# Patient Record
Sex: Female | Born: 1960 | Race: Black or African American | Hispanic: No | Marital: Married | Smoking: Former smoker
Health system: Southern US, Community
[De-identification: ages and names within clinical notes are randomized; demographics above are authoritative.]

## PROBLEM LIST (undated history)

## (undated) DIAGNOSIS — I10 Essential (primary) hypertension: Secondary | ICD-10-CM

---

## 1999-09-21 ENCOUNTER — Encounter: Payer: Self-pay | Admitting: Neurosurgery

## 1999-09-21 ENCOUNTER — Ambulatory Visit (HOSPITAL_COMMUNITY): Admission: RE | Admit: 1999-09-21 | Discharge: 1999-09-21 | Payer: Self-pay | Admitting: Neurosurgery

## 1999-10-22 ENCOUNTER — Encounter: Payer: Self-pay | Admitting: Neurosurgery

## 1999-10-22 ENCOUNTER — Observation Stay (HOSPITAL_COMMUNITY): Admission: RE | Admit: 1999-10-22 | Discharge: 1999-10-22 | Payer: Self-pay | Admitting: Neurosurgery

## 1999-12-16 ENCOUNTER — Ambulatory Visit (HOSPITAL_COMMUNITY): Admission: RE | Admit: 1999-12-16 | Discharge: 1999-12-16 | Payer: Self-pay | Admitting: Neurosurgery

## 1999-12-16 ENCOUNTER — Encounter: Payer: Self-pay | Admitting: Neurosurgery

## 2009-07-24 ENCOUNTER — Emergency Department: Payer: Self-pay | Admitting: Emergency Medicine
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
## 2016-11-22 DIAGNOSIS — E782 Mixed hyperlipidemia: Secondary | ICD-10-CM | POA: Insufficient documentation

## 2017-11-17 LAB — HM HEPATITIS C SCREENING LAB: HM Hepatitis Screen: NEGATIVE

## 2018-05-05 LAB — HM COLONOSCOPY

## 2019-11-07 ENCOUNTER — Encounter: Payer: Self-pay | Admitting: Emergency Medicine

## 2019-11-07 ENCOUNTER — Other Ambulatory Visit: Payer: Self-pay

## 2019-11-07 ENCOUNTER — Ambulatory Visit
Admission: EM | Admit: 2019-11-07 | Discharge: 2019-11-07 | Disposition: A | Discharge status: Home / Self Care | Payer: BLUE CROSS/BLUE SHIELD | Attending: Family Medicine | Admitting: Family Medicine

## 2019-11-07 DIAGNOSIS — Z20828 Contact with and (suspected) exposure to other viral communicable diseases: Secondary | ICD-10-CM

## 2019-11-07 DIAGNOSIS — Z20822 Contact with and (suspected) exposure to covid-19: Secondary | ICD-10-CM

## 2019-11-07 HISTORY — DX: Essential (primary) hypertension: I10

## 2019-11-07 NOTE — ED Triage Notes (Signed)
Patient was exposed to niece that tested positive yesterday for COVID. Denies symptoms. She was around her niece on Monday.

## 2019-11-07 NOTE — ED Provider Notes (Signed)
[facility]    CSN: [id number] Arrival date & time: 11/07/19  1519      History   Chief Complaint Chief Complaint  Patient presents with  . COVID testing    HPI Diamond Crawford is a 58 y.o. female.   58 yo female with a c/o covid exposure and request for covid testing. States she feels fine and has no symptoms, however her niece was positive and patient was around her recently.      Past Medical History:  Diagnosis Date  . Hypertension     There are no problems to display for this patient.   History reviewed. No pertinent surgical history.  OB History   No obstetric history on file.      Home Medications    Prior to Admission medications   Medication Sig Start Date End Date Taking? Authorizing Provider  amLODipine (NORVASC) 5 MG tablet Take 5 mg by mouth daily. 10/16/19   [provider]    Family History History reviewed. No pertinent family history.  Social History Social History   Tobacco Use  . Smoking status: Never Smoker  . Smokeless tobacco: Never Used  Substance Use Topics  . Alcohol use: Yes  . Drug use: Never     Allergies   Patient has no known allergies.   Review of Systems Review of Systems   Physical Exam Triage Vital Signs ED Triage Vitals  Enc Vitals Group     BP 11/07/19 1540 139/88     Pulse Rate 11/07/19 1540 95     Resp 11/07/19 1540 18     Temp 11/07/19 1540 98.8 F (37.1 C)     Temp Source 11/07/19 1540 Oral     SpO2 11/07/19 1540 100 %     Weight 11/07/19 1538 127 lb (57.6 kg)     Height 11/07/19 1538 5\' 1"  (1.549 m)     Head Circumference --      Peak Flow --      Pain Score 11/07/19 1538 0     Pain Loc --      Pain Edu? --      Excl. in GC? --    No data found.  Updated Vital Signs BP 139/88 (BP Location: Right Arm)   Pulse 95   Temp 98.8 F (37.1 C) (Oral)   Resp 18   Ht 5\' 1"  (1.549 m)   Wt 57.6 kg   SpO2 100%   BMI 24.00 kg/m   Visual Acuity Right Eye Distance:     Left Eye Distance:   Bilateral Distance:    Right Eye Near:   Left Eye Near:    Bilateral Near:     Physical Exam Vitals and nursing note reviewed.  Constitutional:      General: She is not in acute distress.    Appearance: She is not toxic-appearing or diaphoretic.  Pulmonary:     Effort: Pulmonary effort is normal. No respiratory distress.  Neurological:     Mental Status: She is alert.      UC Treatments / Results  Labs (all labs ordered are listed, but only abnormal results are displayed) Labs Reviewed  NOVEL CORONAVIRUS, NAA (HOSP ORDER, SEND-OUT TO REF LAB; TAT 18-24 HRS)    EKG   Radiology No results found.  Procedures Procedures (including critical care time)  Medications Ordered in UC Medications - No data to display  Initial Impression / Assessment and Plan / UC Course  I have reviewed  the triage vital signs and the nursing notes.  Pertinent labs & imaging results that were available during my care of the patient were reviewed by me and considered in my medical decision making (see chart for details).      Final Clinical Impressions(s) / UC Diagnoses   Final diagnoses:  Exposure to COVID-19 virus    ED Prescriptions    None      1. diagnosis reviewed with patient 2. covid test done 3. Follow-up prn  PDMP not reviewed this encounter.   Norval Gable, MD 11/07/19 1616

## 2019-11-08 LAB — NOVEL CORONAVIRUS, NAA (HOSP ORDER, SEND-OUT TO REF LAB; TAT 18-24 HRS): SARS-CoV-2, NAA: NOT DETECTED

## 2020-12-25 LAB — RESULTS CONSOLE HPV: CHL HPV: NEGATIVE

## 2020-12-25 LAB — HM PAP SMEAR: HM Pap smear: NEGATIVE

## 2021-12-08 LAB — HM MAMMOGRAPHY

## 2022-07-16 LAB — LIPID PANEL
Cholesterol: 212 — AB (ref 0–200)
HDL: 62 (ref 35–70)
LDL Cholesterol: 72
Triglycerides: 392 — AB (ref 40–160)

## 2022-07-16 LAB — COMPREHENSIVE METABOLIC PANEL: Calcium: 9.5 (ref 8.7–10.7)

## 2022-07-16 LAB — BASIC METABOLIC PANEL
BUN: 11 (ref 4–21)
Chloride: 107 (ref 99–108)
Creatinine: 0.9 (ref 0.5–1.1)
Glucose: 87
Potassium: 3.6 mEq/L (ref 3.5–5.1)
Sodium: 138 (ref 137–147)

## 2022-07-16 LAB — HEPATIC FUNCTION PANEL
ALT: 24 U/L (ref 7–35)
AST: 33 (ref 13–35)
Alkaline Phosphatase: 63 (ref 25–125)
Bilirubin, Total: 0.4

## 2022-07-16 LAB — HEMOGLOBIN A1C: Hemoglobin A1C: 5.5

## 2022-12-08 LAB — HM MAMMOGRAPHY

## 2023-03-29 ENCOUNTER — Encounter: Payer: Self-pay | Admitting: Internal Medicine

## 2023-03-29 ENCOUNTER — Other Ambulatory Visit: Payer: Self-pay | Admitting: Internal Medicine

## 2023-03-29 ENCOUNTER — Ambulatory Visit (INDEPENDENT_AMBULATORY_CARE_PROVIDER_SITE_OTHER): Payer: 59 | Admitting: Internal Medicine

## 2023-03-29 VITALS — BP 122/66 | Ht 61.0 in | Wt 137.0 lb

## 2023-03-29 DIAGNOSIS — Z1231 Encounter for screening mammogram for malignant neoplasm of breast: Secondary | ICD-10-CM

## 2023-03-29 DIAGNOSIS — I10 Essential (primary) hypertension: Secondary | ICD-10-CM | POA: Diagnosis not present

## 2023-03-29 MED ORDER — AMLODIPINE BESYLATE 5 MG PO TABS: 5.0000 mg | ORAL_TABLET | Freq: Every day | ORAL | 1 refills | Status: DC

## 2023-03-29 NOTE — Patient Instructions (Addendum)
Start a fish oil supplement daily for your triglycerides and lipids.  Call [facility] to schedule your mammogram at [phone].   -It was a pleasure to see you today! Please review your visit summary for helpful information. -Lab results are usually available within 1-2 days and we will call once reviewed. -I would encourage you to follow your care via MyChart where you can access lab results, notes, messages, and more. -If you feel that we did a nice job today, please complete your after-visit survey and leave [place] a Google review! Your CMA today was Chassidy and your provider was Dr Bari Edward, MD.

## 2023-03-29 NOTE — Progress Notes (Signed)
Date:  03/29/2023   Name:  Diamond Crawford   DOB:  [birth year]   MRN:  [id number]   Chief Complaint: New Patient (Initial Visit)  Hypertension This is a chronic problem. The problem is controlled. Pertinent negatives include no chest pain, headaches, palpitations or shortness of breath. Past treatments include calcium channel blockers. The current treatment provides significant improvement. There is no history of kidney disease, CAD/MI or CVA.    Lab Results  Component Value Date   NA 138 07/16/2022   K 3.6 07/16/2022   BUN 11 07/16/2022   CREATININE 0.9 07/16/2022   CALCIUM 9.5 07/16/2022   Lab Results  Component Value Date   CHOL 212 (A) 07/16/2022   HDL 62 07/16/2022   LDLCALC 72 07/16/2022   TRIG 392 (A) 07/16/2022   No results found for: "TSH" Lab Results  Component Value Date   HGBA1C 5.5 07/16/2022   No results found for: "WBC", "HGB", "HCT", "MCV", "PLT" Lab Results  Component Value Date   ALT 24 07/16/2022   AST 33 07/16/2022   ALKPHOS 63 07/16/2022   No results found for: "25OHVITD2", "25OHVITD3", "VD25OH"   Review of Systems  Constitutional:  Negative for fatigue and unexpected weight change.  HENT:  Negative for nosebleeds.   Eyes:  Negative for visual disturbance.  Respiratory:  Negative for cough, chest tightness, shortness of breath and wheezing.   Cardiovascular:  Negative for chest pain, palpitations and leg swelling.  Gastrointestinal:  Negative for abdominal pain, constipation and diarrhea.  Genitourinary:  Negative for dyspareunia and pelvic pain.       Decreased libido, intermittent hot flashes  Musculoskeletal:  Negative for arthralgias and gait problem.  Neurological:  Negative for dizziness, weakness, light-headedness and headaches.  Psychiatric/Behavioral:  Negative for hallucinations and sleep disturbance. The patient is not nervous/anxious.     Patient Active Problem List   Diagnosis Date Noted   Essential hypertension 03/29/2023    Mixed hyperlipidemia 11/22/2016    No Known Allergies  History reviewed. No pertinent surgical history.  Social History   Tobacco Use   Smoking status: Former    Packs/day: 0.50    Years: 20.00    Additional pack years: 0.00    Total pack years: 10.00    Types: Cigarettes    Quit date: 2004    Years since quitting: 20.3   Smokeless tobacco: Never  Vaping Use   Vaping Use: Never used  Substance Use Topics   Alcohol use: Yes    Comment: rare   Drug use: Never     Medication list has been reviewed and updated.  Current Meds  Medication Sig   Black Cohosh (BLACK COHOSH HOT FLASH RELIEF) 40 MG CAPS Take 1 capsule by mouth daily at 6 (six) AM.   Flaxseed, Linseed, (FLAX SEED OIL) 1000 MG CAPS Take 1 capsule by mouth daily at 6 (six) AM.   Multiple Vitamin (MULTI-VITAMIN) tablet Take 1 tablet by mouth daily.   [DISCONTINUED] amLODipine (NORVASC) 5 MG tablet Take 5 mg by mouth daily.       03/29/2023    2:24 PM  GAD 7 : Generalized Anxiety Score  Nervous, Anxious, on Edge 0  Control/stop worrying 0  Worry too much - different things 0  Trouble relaxing 0  Restless 1  Easily annoyed or irritable 0  Afraid - awful might happen 0  Total GAD 7 Score 1  Anxiety Difficulty Not difficult at all  03/29/2023    2:24 PM  Depression screen PHQ 2/9  Decreased Interest 0  Down, Depressed, Hopeless 0  PHQ - 2 Score 0  Altered sleeping 0  Tired, decreased energy 1  Change in appetite 0  Feeling bad or failure about yourself  0  Trouble concentrating 0  Moving slowly or fidgety/restless 0  Suicidal thoughts 0  PHQ-9 Score 1  Difficult doing work/chores Not difficult at all    BP Readings from Last 3 Encounters:  03/29/23 122/66  11/07/19 139/88    Physical Exam Vitals and nursing note reviewed.  Constitutional:      General: She is not in acute distress.    Appearance: Normal appearance. She is well-developed.  HENT:     Head: Normocephalic and atraumatic.   Neck:     Vascular: No carotid bruit.  Cardiovascular:     Rate and Rhythm: Normal rate and regular rhythm.     Heart sounds: No murmur heard. Pulmonary:     Effort: Pulmonary effort is normal. No respiratory distress.     Breath sounds: No wheezing or rhonchi.  Musculoskeletal:     Cervical back: Normal range of motion.     Right lower leg: No edema.     Left lower leg: No edema.  Lymphadenopathy:     Cervical: No cervical adenopathy.  Skin:    General: Skin is warm and dry.     Findings: No rash.  Neurological:     General: No focal deficit present.     Mental Status: She is alert and oriented to person, place, and time.  Psychiatric:        Mood and Affect: Mood normal.        Behavior: Behavior normal.     Wt Readings from Last 3 Encounters:  03/29/23 137 lb (62.1 kg)  11/07/19 127 lb (57.6 kg)    BP 122/66 (BP Location: Right Arm, Cuff Size: Large)   Ht 5\' 1"  (1.549 m)   Wt 137 lb (62.1 kg)   BMI 25.89 kg/m   Assessment and Plan:  Problem List Items Addressed This Visit       Cardiovascular and Mediastinum   Essential hypertension - Primary    Stable exam with well controlled BP for the past few years.  Currently taking amlodipine. Tolerating medications without concerns or side effects. Will continue to recommend low sodium diet and current regimen.       Relevant Medications   amLODipine (NORVASC) 5 MG tablet   Other Visit Diagnoses     Encounter for screening mammogram for breast cancer       Relevant Orders   MM 3D SCREENING MAMMOGRAM BILATERAL BREAST       Return in about 4 months (around 07/30/2023) for CPX.   Partially dictated using Dragon software, any errors are not intentional.  Reubin Milan, MD [facility] [place], [place]

## 2023-03-29 NOTE — Assessment & Plan Note (Addendum)
Stable exam with well controlled BP for the past few years.  Currently taking amlodipine. Tolerating medications without concerns or side effects. Will continue to recommend low sodium diet and current regimen.

## 2023-03-30 ENCOUNTER — Other Ambulatory Visit: Payer: Self-pay | Admitting: *Deleted

## 2023-03-30 ENCOUNTER — Inpatient Hospital Stay
Admission: RE | Admit: 2023-03-30 | Discharge: 2023-03-30 | Disposition: A | Discharge status: Home / Self Care | Payer: Self-pay | Source: Ambulatory Visit | Attending: Internal Medicine | Admitting: Internal Medicine

## 2023-03-30 DIAGNOSIS — Z1231 Encounter for screening mammogram for malignant neoplasm of breast: Secondary | ICD-10-CM

## 2023-05-02 ENCOUNTER — Ambulatory Visit
Admission: RE | Admit: 2023-05-02 | Discharge: 2023-05-02 | Disposition: A | Discharge status: Home / Self Care | Payer: 59 | Source: Ambulatory Visit | Attending: Internal Medicine | Admitting: Internal Medicine

## 2023-05-02 DIAGNOSIS — Z1231 Encounter for screening mammogram for malignant neoplasm of breast: Secondary | ICD-10-CM | POA: Diagnosis not present

## 2023-05-03 ENCOUNTER — Other Ambulatory Visit: Payer: Self-pay | Admitting: Internal Medicine

## 2023-05-03 DIAGNOSIS — N6489 Other specified disorders of breast: Secondary | ICD-10-CM

## 2023-05-03 DIAGNOSIS — R928 Other abnormal and inconclusive findings on diagnostic imaging of breast: Secondary | ICD-10-CM

## 2023-05-11 ENCOUNTER — Ambulatory Visit
Admission: RE | Admit: 2023-05-11 | Discharge: 2023-05-11 | Disposition: A | Discharge status: Home / Self Care | Payer: 59 | Source: Ambulatory Visit | Attending: Internal Medicine | Admitting: Internal Medicine

## 2023-05-11 DIAGNOSIS — N6489 Other specified disorders of breast: Secondary | ICD-10-CM | POA: Insufficient documentation

## 2023-05-11 DIAGNOSIS — R928 Other abnormal and inconclusive findings on diagnostic imaging of breast: Secondary | ICD-10-CM | POA: Insufficient documentation

## 2023-09-01 ENCOUNTER — Encounter: Payer: Self-pay | Admitting: Internal Medicine

## 2023-09-01 ENCOUNTER — Ambulatory Visit (INDEPENDENT_AMBULATORY_CARE_PROVIDER_SITE_OTHER): Payer: 59 | Admitting: Internal Medicine

## 2023-09-01 VITALS — BP 132/84 | HR 64 | Ht 61.0 in | Wt 132.4 lb

## 2023-09-01 DIAGNOSIS — I10 Essential (primary) hypertension: Secondary | ICD-10-CM | POA: Diagnosis not present

## 2023-09-01 DIAGNOSIS — E782 Mixed hyperlipidemia: Secondary | ICD-10-CM | POA: Diagnosis not present

## 2023-09-01 DIAGNOSIS — S56912A Strain of unspecified muscles, fascia and tendons at forearm level, left arm, initial encounter: Secondary | ICD-10-CM

## 2023-09-01 DIAGNOSIS — Z23 Encounter for immunization: Secondary | ICD-10-CM | POA: Diagnosis not present

## 2023-09-01 DIAGNOSIS — Z0000 Encounter for general adult medical examination without abnormal findings: Secondary | ICD-10-CM | POA: Diagnosis not present

## 2023-09-01 MED ORDER — AMLODIPINE BESYLATE 10 MG PO TABS: 10.0000 mg | ORAL_TABLET | Freq: Every day | ORAL | 1 refills | Status: DC

## 2023-09-01 NOTE — Assessment & Plan Note (Signed)
Lipids managed with diet. Lab Results  Component Value Date   LDLCALC 72 07/16/2022

## 2023-09-01 NOTE — Patient Instructions (Signed)
For elbow pain - recommend limited lifting, ice or heat and Advil if needed

## 2023-09-01 NOTE — Assessment & Plan Note (Addendum)
Normal exam with stable BP on amlodipine.  Readings running 138-140 at home. No concerns or side effects to current medication. continue low sodium diet; will increase Amlodipine to 10 mg. Recheck in 2 mo.

## 2023-09-01 NOTE — Progress Notes (Signed)
Date:  09/01/2023   Name:  Diamond Crawford   DOB:  08-Jan-1961   MRN:  [id number]   Chief Complaint: Annual Exam Diamond Crawford is a 62 y.o. female who presents today for her Complete Annual Exam. She feels well. She reports exercising- walking. She reports she is sleeping well. Breast complaints - none.  Mammogram: 04/2023 DEXA: none Colonoscopy: 04/2018 Pap: 12/2020 neg/neg  Health Maintenance Due  Topic Date Due   HIV Screening  Never done   Zoster Vaccines- Shingrix (1 of 2) Never done   COVID-19 Vaccine (5 - 2023-24 season) 07/24/2023    Immunization History  Administered Date(s) Administered   Hepatitis B, ADULT 06/21/2014, 07/22/2014, 12/23/2014   Influenza Inj Mdck Quad Pf 12/25/2020   Influenza, Seasonal, Injecte, Preservative Fre 09/01/2023   Influenza,inj,Quad PF,6+ Mos Jun 06, 1961, 08/24/2017, 10/16/2019, 08/10/2022   MMR 06/21/2014   Moderna Sars-Covid-2 Vaccination 02/01/2020, 02/29/2020, 05/26/2020, 02/06/2021   Tdap 06/21/2014     Hypertension This is a chronic problem. The problem is controlled (at home 138-140). Pertinent negatives include no anxiety, chest pain, headaches, malaise/fatigue, orthopnea, palpitations, peripheral edema or shortness of breath. Past treatments include calcium channel blockers. There is no history of kidney disease, CAD/MI or CVA.    Review of Systems  Constitutional:  Negative for chills, fatigue, fever and malaise/fatigue.  HENT:  Negative for congestion, hearing loss, tinnitus, trouble swallowing and voice change.   Eyes:  Negative for visual disturbance.  Respiratory:  Negative for cough, chest tightness, shortness of breath and wheezing.   Cardiovascular:  Negative for chest pain, palpitations, orthopnea and leg swelling.  Gastrointestinal:  Negative for abdominal pain, constipation, diarrhea and vomiting.  Endocrine: Negative for polydipsia and polyuria.  Genitourinary:  Negative for dysuria, frequency, genital sores, vaginal  bleeding and vaginal discharge.  Musculoskeletal:  Positive for arthralgias (left elbow pain). Negative for gait problem and joint swelling.  Skin:  Negative for color change and rash.  Neurological:  Negative for dizziness, tremors, light-headedness and headaches.  Hematological:  Negative for adenopathy. Does not bruise/bleed easily.  Psychiatric/Behavioral:  Negative for dysphoric mood and sleep disturbance. The patient is not nervous/anxious.      Lab Results  Component Value Date   NA 138 07/16/2022   K 3.6 07/16/2022   BUN 11 07/16/2022   CREATININE 0.9 07/16/2022   CALCIUM 9.5 07/16/2022   Lab Results  Component Value Date   CHOL 212 (A) 07/16/2022   HDL 62 07/16/2022   LDLCALC 72 07/16/2022   TRIG 392 (A) 07/16/2022   No results found for: "TSH" Lab Results  Component Value Date   HGBA1C 5.5 07/16/2022   No results found for: "WBC", "HGB", "HCT", "MCV", "PLT" Lab Results  Component Value Date   ALT 24 07/16/2022   AST 33 07/16/2022   ALKPHOS 63 07/16/2022   No results found for: "25OHVITD2", "25OHVITD3", "VD25OH"   Patient Active Problem List   Diagnosis Date Noted   Essential hypertension 03/29/2023   Mixed hyperlipidemia 11/22/2016    No Known Allergies  History reviewed. No pertinent surgical history.  Social History   Tobacco Use   Smoking status: Former    Current packs/day: 0.00    Average packs/day: 0.5 packs/day for 20.0 years (10.0 ttl pk-yrs)    Types: Cigarettes    Start date: 57    Quit date: 2004    Years since quitting: 20.7   Smokeless tobacco: Never  Vaping Use   Vaping status: Never Used  Substance Use Topics   Alcohol use: Yes    Comment: rare   Drug use: Never     Medication list has been reviewed and updated.  No outpatient medications have been marked as taking for the 09/01/23 encounter (Office Visit) with Reubin Milan, MD.       09/01/2023    8:14 AM 03/29/2023    2:24 PM  GAD 7 : Generalized Anxiety Score   Nervous, Anxious, on Edge 0 0  Control/stop worrying 0 0  Worry too much - different things 0 0  Trouble relaxing 0 0  Restless 0 1  Easily annoyed or irritable 0 0  Afraid - awful might happen 0 0  Total GAD 7 Score 0 1  Anxiety Difficulty Not difficult at all Not difficult at all       09/01/2023    8:13 AM 03/29/2023    2:24 PM  Depression screen PHQ 2/9  Decreased Interest 0 0  Down, Depressed, Hopeless 0 0  PHQ - 2 Score 0 0  Altered sleeping 0 0  Tired, decreased energy 0 1  Change in appetite 0 0  Feeling bad or failure about yourself  0 0  Trouble concentrating 0 0  Moving slowly or fidgety/restless 0 0  Suicidal thoughts 0 0  PHQ-9 Score 0 1  Difficult doing work/chores Not difficult at all Not difficult at all    BP Readings from Last 3 Encounters:  09/01/23 132/84  03/29/23 122/66  11/07/19 139/88    Physical Exam Vitals and nursing note reviewed.  Constitutional:      General: She is not in acute distress.    Appearance: She is well-developed.  HENT:     Head: Normocephalic and atraumatic.     Right Ear: Tympanic membrane and ear canal normal.     Left Ear: Tympanic membrane and ear canal normal.     Nose:     Right Sinus: No maxillary sinus tenderness.     Left Sinus: No maxillary sinus tenderness.  Eyes:     General: No scleral icterus.       Right eye: No discharge.        Left eye: No discharge.     Conjunctiva/sclera: Conjunctivae normal.  Neck:     Thyroid: No thyromegaly.     Vascular: No carotid bruit.  Cardiovascular:     Rate and Rhythm: Normal rate and regular rhythm.     Pulses: Normal pulses.     Heart sounds: Normal heart sounds.  Pulmonary:     Effort: Pulmonary effort is normal. No respiratory distress.     Breath sounds: No wheezing.  Chest:  Breasts:    Right: No mass, nipple discharge, skin change or tenderness.     Left: No mass, nipple discharge, skin change or tenderness.  Abdominal:     General: Bowel sounds are  normal.     Palpations: Abdomen is soft.     Tenderness: There is no abdominal tenderness.  Musculoskeletal:     Left elbow: No swelling, deformity or effusion. Normal range of motion. Tenderness present.     Cervical back: Normal range of motion. No erythema.     Right lower leg: No edema.     Left lower leg: No edema.  Lymphadenopathy:     Cervical: No cervical adenopathy.  Skin:    General: Skin is warm and dry.     Findings: No rash.  Neurological:     Mental Status: She  is alert and oriented to person, place, and time.     Cranial Nerves: No cranial nerve deficit.     Sensory: No sensory deficit.     Deep Tendon Reflexes: Reflexes are normal and symmetric.  Psychiatric:        Attention and Perception: Attention normal.        Mood and Affect: Mood normal.     Wt Readings from Last 3 Encounters:  09/01/23 132 lb 6.4 oz (60.1 kg)  03/29/23 137 lb (62.1 kg)  11/07/19 127 lb (57.6 kg)    BP 132/84 (BP Location: Left Arm, Cuff Size: Normal)   Pulse 64   Ht 5\' 1"  (1.549 m)   Wt 132 lb 6.4 oz (60.1 kg)   BMI 25.02 kg/m   Assessment and Plan:  Problem List Items Addressed This Visit       Unprioritized   Essential hypertension    Normal exam with stable BP on amlodipine.  Readings running 138-140 at home. No concerns or side effects to current medication. continue low sodium diet; will increase Amlodipine to 10 mg. Recheck in 2 mo.       Relevant Medications   amLODipine (NORVASC) 10 MG tablet   Other Relevant Orders   CBC with Differential/Platelet   Comprehensive metabolic panel   Urinalysis, Routine w reflex microscopic   TSH   Mixed hyperlipidemia    Lipids managed with diet. Lab Results  Component Value Date   LDLCALC 72 07/16/2022         Relevant Medications   amLODipine (NORVASC) 10 MG tablet   Other Relevant Orders   Lipid panel   Other Visit Diagnoses     Annual physical exam    -  Primary   UTD on screenings declines Shingrix    Relevant Orders   CBC with Differential/Platelet   Comprehensive metabolic panel   Lipid panel   Urinalysis, Routine w reflex microscopic   TSH   Elbow strain, left, initial encounter       recommend limited lifting, ice or heat and Advil if needed   Need for influenza vaccination       Relevant Orders   Flu vaccine trivalent PF, 6mos and older(Flulaval,Afluria,Fluarix,Fluzone) (Completed)       Return in about 2 months (around 11/01/2023) for HTN.    Reubin Milan, MD [facility] [place]

## 2023-09-02 LAB — URINALYSIS, ROUTINE W REFLEX MICROSCOPIC
Bilirubin, UA: NEGATIVE
Glucose, UA: NEGATIVE
Ketones, UA: NEGATIVE
Leukocytes,UA: NEGATIVE
Nitrite, UA: NEGATIVE
Protein,UA: NEGATIVE
RBC, UA: NEGATIVE
Specific Gravity, UA: 1.007 (ref 1.005–1.030)
Urobilinogen, Ur: 0.2 mg/dL (ref 0.2–1.0)
pH, UA: 6 (ref 5.0–7.5)

## 2023-09-02 LAB — LIPID PANEL
Chol/HDL Ratio: 3.3 {ratio} (ref 0.0–4.4)
Cholesterol, Total: 214 mg/dL — ABNORMAL HIGH (ref 100–199)
HDL: 64 mg/dL (ref >39)
LDL Chol Calc (NIH): 106 mg/dL — ABNORMAL HIGH (ref 0–99)
Triglycerides: 258 mg/dL — ABNORMAL HIGH (ref 0–149)
VLDL Cholesterol Cal: 44 mg/dL — ABNORMAL HIGH (ref 5–40)

## 2023-09-02 LAB — COMPREHENSIVE METABOLIC PANEL
ALT: 25 [IU]/L (ref 0–32)
AST: 32 [IU]/L (ref 0–40)
Albumin: 4.5 g/dL (ref 3.9–4.9)
Alkaline Phosphatase: 73 [IU]/L (ref 44–121)
BUN/Creatinine Ratio: 15 (ref 12–28)
BUN: 13 mg/dL (ref 8–27)
Bilirubin Total: 0.4 mg/dL (ref 0.0–1.2)
CO2: 22 mmol/L (ref 20–29)
Calcium: 10.2 mg/dL (ref 8.7–10.3)
Chloride: 102 mmol/L (ref 96–106)
Creatinine, Ser: 0.88 mg/dL (ref 0.57–1.00)
Globulin, Total: 3 g/dL (ref 1.5–4.5)
Glucose: 90 mg/dL (ref 70–99)
Potassium: 3.8 mmol/L (ref 3.5–5.2)
Sodium: 140 mmol/L (ref 134–144)
Total Protein: 7.5 g/dL (ref 6.0–8.5)
eGFR: 75 mL/min/{1.73_m2} (ref >59)

## 2023-09-02 LAB — CBC WITH DIFFERENTIAL/PLATELET
Basophils Absolute: 0 10*3/uL (ref 0.0–0.2)
Basos: 0 %
EOS (ABSOLUTE): 0.1 10*3/uL (ref 0.0–0.4)
Eos: 2 %
Hematocrit: 37.5 % (ref 34.0–46.6)
Hemoglobin: 11.7 g/dL (ref 11.1–15.9)
Immature Grans (Abs): 0.1 10*3/uL (ref 0.0–0.1)
Immature Granulocytes: 1 %
Lymphocytes Absolute: 2.4 10*3/uL (ref 0.7–3.1)
Lymphs: 36 %
MCH: 24.8 pg — ABNORMAL LOW (ref 26.6–33.0)
MCHC: 31.2 g/dL — ABNORMAL LOW (ref 31.5–35.7)
MCV: 79 fL (ref 79–97)
Monocytes Absolute: 0.7 10*3/uL (ref 0.1–0.9)
Monocytes: 10 %
Neutrophils Absolute: 3.5 10*3/uL (ref 1.4–7.0)
Neutrophils: 51 %
Platelets: 226 10*3/uL (ref 150–450)
RBC: 4.72 x10E6/uL (ref 3.77–5.28)
RDW: 14.1 % (ref 11.7–15.4)
WBC: 6.9 10*3/uL (ref 3.4–10.8)

## 2023-09-02 LAB — TSH: TSH: 3.18 u[IU]/mL (ref 0.450–4.500)

## 2023-11-01 ENCOUNTER — Ambulatory Visit: Payer: 59 | Admitting: Internal Medicine

## 2023-11-03 DIAGNOSIS — N182 Chronic kidney disease, stage 2 (mild): Secondary | ICD-10-CM | POA: Diagnosis not present

## 2023-11-03 DIAGNOSIS — I129 Hypertensive chronic kidney disease with stage 1 through stage 4 chronic kidney disease, or unspecified chronic kidney disease: Secondary | ICD-10-CM | POA: Diagnosis not present

## 2023-11-03 DIAGNOSIS — E785 Hyperlipidemia, unspecified: Secondary | ICD-10-CM | POA: Diagnosis not present

## 2023-11-03 DIAGNOSIS — Z8249 Family history of ischemic heart disease and other diseases of the circulatory system: Secondary | ICD-10-CM | POA: Diagnosis not present

## 2023-11-03 DIAGNOSIS — Z809 Family history of malignant neoplasm, unspecified: Secondary | ICD-10-CM | POA: Diagnosis not present

## 2023-11-14 ENCOUNTER — Ambulatory Visit: Payer: 59 | Admitting: Internal Medicine

## 2024-01-09 ENCOUNTER — Encounter: Payer: Self-pay | Admitting: Internal Medicine

## 2024-01-09 ENCOUNTER — Ambulatory Visit (INDEPENDENT_AMBULATORY_CARE_PROVIDER_SITE_OTHER): Payer: 59 | Admitting: Internal Medicine

## 2024-01-09 VITALS — BP 129/74 | HR 85 | Ht 61.0 in | Wt 133.0 lb

## 2024-01-09 DIAGNOSIS — Z1231 Encounter for screening mammogram for malignant neoplasm of breast: Secondary | ICD-10-CM

## 2024-01-09 DIAGNOSIS — I10 Essential (primary) hypertension: Secondary | ICD-10-CM

## 2024-01-09 MED ORDER — AMLODIPINE BESYLATE 10 MG PO TABS: 10.0000 mg | ORAL_TABLET | Freq: Every day | ORAL | 5 refills | Status: DC

## 2024-01-09 NOTE — Patient Instructions (Signed)
 Call [facility] to schedule your mammogram at [phone].

## 2024-01-09 NOTE — Progress Notes (Signed)
 Date:  01/09/2024   Name:  Diamond Crawford   DOB:  1961/05/26   MRN:  [id number]   Chief Complaint: No chief complaint on file.  Hypertension This is a chronic problem. The problem is controlled. Pertinent negatives include no chest pain, headaches, palpitations or shortness of breath. Past treatments include calcium channel blockers. The current treatment provides significant improvement.    Review of Systems  Constitutional:  Negative for fatigue and unexpected weight change.  HENT:  Negative for nosebleeds.   Eyes:  Negative for visual disturbance.  Respiratory:  Negative for cough, chest tightness, shortness of breath and wheezing.   Cardiovascular:  Negative for chest pain, palpitations and leg swelling.  Gastrointestinal:  Negative for abdominal pain, constipation and diarrhea.  Neurological:  Negative for dizziness, weakness, light-headedness and headaches.     Lab Results  Component Value Date   NA 140 09/01/2023   K 3.8 09/01/2023   CO2 22 09/01/2023   GLUCOSE 90 09/01/2023   BUN 13 09/01/2023   CREATININE 0.88 09/01/2023   CALCIUM 10.2 09/01/2023   EGFR 75 09/01/2023   Lab Results  Component Value Date   CHOL 214 (H) 09/01/2023   HDL 64 09/01/2023   LDLCALC 106 (H) 09/01/2023   TRIG 258 (H) 09/01/2023   CHOLHDL 3.3 09/01/2023   Lab Results  Component Value Date   TSH 3.180 09/01/2023   Lab Results  Component Value Date   HGBA1C 5.5 07/16/2022   Lab Results  Component Value Date   WBC 6.9 09/01/2023   HGB 11.7 09/01/2023   HCT 37.5 09/01/2023   MCV 79 09/01/2023   PLT 226 09/01/2023   Lab Results  Component Value Date   ALT 25 09/01/2023   AST 32 09/01/2023   ALKPHOS 73 09/01/2023   BILITOT 0.4 09/01/2023   No results found for: "25OHVITD2", "25OHVITD3", "VD25OH"   Patient Active Problem List   Diagnosis Date Noted   Essential hypertension 03/29/2023   Mixed hyperlipidemia 11/22/2016    No Known Allergies  History reviewed. No  pertinent surgical history.  Social History   Tobacco Use   Smoking status: Former    Current packs/day: 0.00    Average packs/day: 0.5 packs/day for 20.0 years (10.0 ttl pk-yrs)    Types: Cigarettes    Start date: 76    Quit date: 2004    Years since quitting: 21.1   Smokeless tobacco: Never  Vaping Use   Vaping status: Never Used  Substance Use Topics   Alcohol use: Yes    Comment: rare   Drug use: Never     Medication list has been reviewed and updated.  Current Meds  Medication Sig   Black Cohosh (BLACK COHOSH HOT FLASH RELIEF) 40 MG CAPS Take 1 capsule by mouth daily at 6 (six) AM.   Flaxseed, Linseed, (FLAX SEED OIL) 1000 MG CAPS Take 1 capsule by mouth daily at 6 (six) AM.   Multiple Vitamin (MULTI-VITAMIN) tablet Take 1 tablet by mouth daily.   [DISCONTINUED] amLODipine (NORVASC) 10 MG tablet Take 1 tablet (10 mg total) by mouth daily.       01/09/2024    4:16 PM 09/01/2023    8:14 AM 03/29/2023    2:24 PM  GAD 7 : Generalized Anxiety Score  Nervous, Anxious, on Edge 0 0 0  Control/stop worrying 0 0 0  Worry too much - different things 0 0 0  Trouble relaxing 0 0 0  Restless 0 0 1  Easily annoyed or irritable 0 0 0  Afraid - awful might happen 0 0 0  Total GAD 7 Score 0 0 1  Anxiety Difficulty Not difficult at all Not difficult at all Not difficult at all       01/09/2024    4:16 PM 09/01/2023    8:13 AM 03/29/2023    2:24 PM  Depression screen PHQ 2/9  Decreased Interest 0 0 0  Down, Depressed, Hopeless 0 0 0  PHQ - 2 Score 0 0 0  Altered sleeping  0 0  Tired, decreased energy  0 1  Change in appetite  0 0  Feeling bad or failure about yourself   0 0  Trouble concentrating  0 0  Moving slowly or fidgety/restless  0 0  Suicidal thoughts  0 0  PHQ-9 Score  0 1  Difficult doing work/chores  Not difficult at all Not difficult at all    BP Readings from Last 3 Encounters:  01/09/24 129/74  09/01/23 132/84  03/29/23 122/66    Physical  Exam Vitals and nursing note reviewed.  Constitutional:      General: She is not in acute distress.    Appearance: Normal appearance. She is well-developed.  HENT:     Head: Normocephalic and atraumatic.  Cardiovascular:     Rate and Rhythm: Normal rate and regular rhythm.     Heart sounds: No murmur heard. Pulmonary:     Effort: Pulmonary effort is normal. No respiratory distress.     Breath sounds: No wheezing or rhonchi.  Musculoskeletal:     Cervical back: Normal range of motion.     Right lower leg: No edema.     Left lower leg: No edema.  Lymphadenopathy:     Cervical: No cervical adenopathy.  Skin:    General: Skin is warm and dry.     Capillary Refill: Capillary refill takes less than 2 seconds.     Findings: No rash.  Neurological:     General: No focal deficit present.     Mental Status: She is alert and oriented to person, place, and time.  Psychiatric:        Mood and Affect: Mood normal.        Behavior: Behavior normal.     Wt Readings from Last 3 Encounters:  01/09/24 133 lb (60.3 kg)  09/01/23 132 lb 6.4 oz (60.1 kg)  03/29/23 137 lb (62.1 kg)    BP 129/74   Pulse 85   Ht 5\' 1"  (1.549 m)   Wt 133 lb (60.3 kg)   SpO2 95%   BMI 25.13 kg/m   Assessment and Plan:  Problem List Items Addressed This Visit       Unprioritized   Essential hypertension - Primary   Controlled BP with normal exam. Current regimen is amlodipine 10 mg. Will continue same medications; encourage continued reduced sodium diet.       Relevant Medications   amLODipine (NORVASC) 10 MG tablet   Other Visit Diagnoses       Encounter for screening mammogram for breast cancer       Relevant Orders   MM 3D SCREENING MAMMOGRAM BILATERAL BREAST       Return in about 8 months (around 09/07/2024) for CPX.    Reubin Milan, MD [facility] [place]

## 2024-01-09 NOTE — Assessment & Plan Note (Signed)
 Controlled BP with normal exam. Current regimen is amlodipine 10 mg. Will continue same medications; encourage continued reduced sodium diet.

## 2024-05-15 ENCOUNTER — Ambulatory Visit
Admission: RE | Admit: 2024-05-15 | Discharge: 2024-05-15 | Disposition: A | Discharge status: Home / Self Care | Source: Ambulatory Visit | Attending: Internal Medicine | Admitting: Internal Medicine

## 2024-05-15 DIAGNOSIS — Z1231 Encounter for screening mammogram for malignant neoplasm of breast: Secondary | ICD-10-CM | POA: Diagnosis not present

## 2024-09-10 ENCOUNTER — Encounter: Payer: Self-pay | Admitting: Internal Medicine

## 2024-09-10 ENCOUNTER — Ambulatory Visit (INDEPENDENT_AMBULATORY_CARE_PROVIDER_SITE_OTHER): Payer: 59 | Admitting: Internal Medicine

## 2024-09-10 VITALS — BP 118/76 | HR 91 | Ht 61.0 in | Wt 137.0 lb

## 2024-09-10 DIAGNOSIS — E782 Mixed hyperlipidemia: Secondary | ICD-10-CM

## 2024-09-10 DIAGNOSIS — I10 Essential (primary) hypertension: Secondary | ICD-10-CM | POA: Diagnosis not present

## 2024-09-10 DIAGNOSIS — Z0000 Encounter for general adult medical examination without abnormal findings: Secondary | ICD-10-CM | POA: Diagnosis not present

## 2024-09-10 MED ORDER — AMLODIPINE BESYLATE 10 MG PO TABS
10.0000 mg | ORAL_TABLET | Freq: Every day | ORAL | 5 refills | Status: AC
Start: 2024-09-10 — End: ?

## 2024-09-10 NOTE — Assessment & Plan Note (Signed)
 Well controlled blood pressure today. Current regimen is amlodipine  10 mg. No medication side effects noted.

## 2024-09-10 NOTE — Assessment & Plan Note (Signed)
 LDL is  Lab Results  Component Value Date   LDLCALC 106 (H) 09/01/2023   Current medication regimen is none - only diet and exercise. Goal LDL is < 130.

## 2024-09-10 NOTE — Progress Notes (Signed)
 Date:  09/10/2024   Name:  Diamond Crawford   DOB:  04-28-1961   MRN:  [id number]   Chief Complaint: Annual Exam Diamond Crawford is a 63 y.o. female who presents today for her Complete Annual Exam. She feels well. She reports exercising walking 5 days a week. She reports she is sleeping well. Breast complaints none.  Health Maintenance  Topic Date Due   HIV Screening  Never done   Zoster (Shingles) Vaccine (1 of 2) Never done   COVID-19 Vaccine (5 - 2025-26 season) 07/23/2024   DTaP/Tdap/Td vaccine (2 - Td or Tdap) 09/10/2025*   Pneumococcal Vaccine for age over 34 (1 of 1 - PCV) 09/10/2025*   Breast Cancer Screening  05/15/2025   Pap with HPV screening  12/25/2025   Colon Cancer Screening  05/05/2028   Flu Shot  Completed   Hepatitis B Vaccine  Completed   Hepatitis C Screening  Completed   HPV Vaccine  Aged Out   Meningitis B Vaccine  Aged Out  *Topic was postponed. The date shown is not the original due date.    Hypertension This is a chronic problem. The problem is controlled. Pertinent negatives include no chest pain, headaches, palpitations or shortness of breath. Past treatments include calcium channel blockers. The current treatment provides significant improvement. There are no compliance problems.  There is no history of kidney disease, CAD/MI or CVA.  Hyperlipidemia This is a chronic problem. Recent lipid tests were reviewed and are high. Pertinent negatives include no chest pain, myalgias or shortness of breath. Current antihyperlipidemic treatment includes diet change and exercise. The current treatment provides mild improvement of lipids.    Review of Systems  Constitutional:  Negative for fatigue and unexpected weight change.  HENT:  Negative for trouble swallowing.   Eyes:  Negative for visual disturbance.  Respiratory:  Negative for cough, chest tightness, shortness of breath and wheezing.   Cardiovascular:  Negative for chest pain, palpitations and leg  swelling.  Gastrointestinal:  Negative for abdominal pain, constipation and diarrhea.  Musculoskeletal:  Negative for arthralgias and myalgias.  Neurological:  Negative for dizziness, weakness, light-headedness and headaches.     Lab Results  Component Value Date   NA 140 09/01/2023   K 3.8 09/01/2023   CO2 22 09/01/2023   GLUCOSE 90 09/01/2023   BUN 13 09/01/2023   CREATININE 0.88 09/01/2023   CALCIUM 10.2 09/01/2023   EGFR 75 09/01/2023   Lab Results  Component Value Date   CHOL 214 (H) 09/01/2023   HDL 64 09/01/2023   LDLCALC 106 (H) 09/01/2023   TRIG 258 (H) 09/01/2023   CHOLHDL 3.3 09/01/2023   Lab Results  Component Value Date   TSH 3.180 09/01/2023   Lab Results  Component Value Date   HGBA1C 5.5 07/16/2022   Lab Results  Component Value Date   WBC 6.9 09/01/2023   HGB 11.7 09/01/2023   HCT 37.5 09/01/2023   MCV 79 09/01/2023   PLT 226 09/01/2023   Lab Results  Component Value Date   ALT 25 09/01/2023   AST 32 09/01/2023   ALKPHOS 73 09/01/2023   BILITOT 0.4 09/01/2023   No results found for: MARIEN BOLLS, VD25OH   Patient Active Problem List   Diagnosis Date Noted   Essential hypertension 03/29/2023   Mixed hyperlipidemia 11/22/2016    No Known Allergies  History reviewed. No pertinent surgical history.  Social History   Tobacco Use   Smoking status: Former  Current packs/day: 0.00    Average packs/day: 0.5 packs/day for 20.0 years (10.0 ttl pk-yrs)    Types: Cigarettes    Start date: 36    Quit date: 2004    Years since quitting: 21.8   Smokeless tobacco: Never   Tobacco comments:    I no longer use tobacco.  Vaping Use   Vaping status: Never Used  Substance Use Topics   Alcohol use: Yes    Alcohol/week: 2.0 standard drinks of alcohol    Types: 2 Cans of beer per week    Comment: rare   Drug use: Not Currently    Types: Marijuana     Medication list has been reviewed and updated.  Current Meds   Medication Sig   Black Cohosh (BLACK COHOSH HOT FLASH RELIEF) 40 MG CAPS Take 1 capsule by mouth daily at 6 (six) AM.   Flaxseed, Linseed, (FLAX SEED OIL) 1000 MG CAPS Take 1 capsule by mouth daily at 6 (six) AM.   Multiple Vitamin (MULTI-VITAMIN) tablet Take 1 tablet by mouth daily.   [DISCONTINUED] amLODipine  (NORVASC ) 10 MG tablet Take 1 tablet (10 mg total) by mouth daily.       09/10/2024    8:31 AM 01/09/2024    4:16 PM 09/01/2023    8:14 AM 03/29/2023    2:24 PM  GAD 7 : Generalized Anxiety Score  Nervous, Anxious, on Edge 0 0 0 0  Control/stop worrying 0 0 0 0  Worry too much - different things 0 0 0 0  Trouble relaxing 0 0 0 0  Restless 0 0 0 1  Easily annoyed or irritable 0 0 0 0  Afraid - awful might happen 0 0 0 0  Total GAD 7 Score 0 0 0 1  Anxiety Difficulty Not difficult at all Not difficult at all Not difficult at all Not difficult at all       09/10/2024    8:31 AM 01/09/2024    4:16 PM 09/01/2023    8:13 AM  Depression screen PHQ 2/9  Decreased Interest 0 0 0  Down, Depressed, Hopeless 0 0 0  PHQ - 2 Score 0 0 0  Altered sleeping   0  Tired, decreased energy   0  Change in appetite   0  Feeling bad or failure about yourself    0  Trouble concentrating   0  Moving slowly or fidgety/restless   0  Suicidal thoughts   0  PHQ-9 Score   0  Difficult doing work/chores   Not difficult at all    BP Readings from Last 3 Encounters:  09/10/24 118/76  01/09/24 129/74  09/01/23 132/84    Physical Exam Vitals and nursing note reviewed.  Constitutional:      General: She is not in acute distress.    Appearance: She is well-developed.  HENT:     Head: Normocephalic and atraumatic.     Right Ear: Tympanic membrane and ear canal normal.     Left Ear: Tympanic membrane and ear canal normal.     Nose:     Right Sinus: No maxillary sinus tenderness.     Left Sinus: No maxillary sinus tenderness.  Eyes:     General: No scleral icterus.       Right eye: No  discharge.        Left eye: No discharge.     Conjunctiva/sclera: Conjunctivae normal.  Neck:     Thyroid: No thyromegaly.  Vascular: No carotid bruit.  Cardiovascular:     Rate and Rhythm: Normal rate and regular rhythm.     Pulses: Normal pulses.     Heart sounds: Normal heart sounds.  Pulmonary:     Effort: Pulmonary effort is normal. No respiratory distress.     Breath sounds: No wheezing.  Chest:  Breasts:    Right: Normal.     Left: Normal.  Abdominal:     General: Bowel sounds are normal.     Palpations: Abdomen is soft.     Tenderness: There is no abdominal tenderness.  Musculoskeletal:     Cervical back: Normal range of motion. No erythema.     Right lower leg: No edema.     Left lower leg: No edema.  Lymphadenopathy:     Cervical: No cervical adenopathy.  Skin:    General: Skin is warm and dry.     Capillary Refill: Capillary refill takes less than 2 seconds.     Findings: No rash.  Neurological:     General: No focal deficit present.     Mental Status: She is alert and oriented to person, place, and time.     Cranial Nerves: No cranial nerve deficit.     Sensory: No sensory deficit.     Deep Tendon Reflexes: Reflexes are normal and symmetric.  Psychiatric:        Attention and Perception: Attention normal.        Mood and Affect: Mood normal.     Wt Readings from Last 3 Encounters:  09/10/24 137 lb (62.1 kg)  01/09/24 133 lb (60.3 kg)  09/01/23 132 lb 6.4 oz (60.1 kg)    BP 118/76   Pulse 91   Ht 5' 1 (1.549 m)   Wt 137 lb (62.1 kg)   SpO2 96%   BMI 25.89 kg/m   Assessment and Plan:  Problem List Items Addressed This Visit       Unprioritized   Essential hypertension   Well controlled blood pressure today. Current regimen is amlodipine  10 mg. No medication side effects noted.        Relevant Medications   amLODipine  (NORVASC ) 10 MG tablet   Other Relevant Orders   CBC with Differential/Platelet   Comprehensive metabolic panel  with GFR   TSH   Urinalysis, Routine w reflex microscopic   Mixed hyperlipidemia   LDL is  Lab Results  Component Value Date   LDLCALC 106 (H) 09/01/2023   Current medication regimen is none - only diet and exercise. Goal LDL is < 130.       Relevant Medications   amLODipine  (NORVASC ) 10 MG tablet   Other Relevant Orders   Lipid panel   Other Visit Diagnoses       Annual physical exam    -  Primary   MM done in June Colonoscopy due 2029 Pap due 2027 consider Shingrix vaccines   Relevant Medications   amLODipine  (NORVASC ) 10 MG tablet   Other Relevant Orders   CBC with Differential/Platelet   Comprehensive metabolic panel with GFR   Lipid panel   TSH   Urinalysis, Routine w reflex microscopic       Return in about 6 months (around 03/11/2025) for TOC HTN Dr. Lemon.    Leita HILARIO Adie, MD [facility] [place]

## 2024-09-11 ENCOUNTER — Ambulatory Visit: Payer: Self-pay | Admitting: Internal Medicine

## 2024-09-11 DIAGNOSIS — E782 Mixed hyperlipidemia: Secondary | ICD-10-CM

## 2024-09-11 LAB — CBC WITH DIFFERENTIAL/PLATELET
Basophils Absolute: 0 x10E3/uL (ref 0.0–0.2)
Basos: 1 %
EOS (ABSOLUTE): 0.2 x10E3/uL (ref 0.0–0.4)
Eos: 3 %
Hematocrit: 40.5 % (ref 34.0–46.6)
Hemoglobin: 12.4 g/dL (ref 11.1–15.9)
Immature Grans (Abs): 0 x10E3/uL (ref 0.0–0.1)
Immature Granulocytes: 0 %
Lymphocytes Absolute: 3 x10E3/uL (ref 0.7–3.1)
Lymphs: 45 %
MCH: 23.7 pg — ABNORMAL LOW (ref 26.6–33.0)
MCHC: 30.6 g/dL — ABNORMAL LOW (ref 31.5–35.7)
MCV: 77 fL — ABNORMAL LOW (ref 79–97)
Monocytes Absolute: 0.6 x10E3/uL (ref 0.1–0.9)
Monocytes: 10 %
Neutrophils Absolute: 2.7 x10E3/uL (ref 1.4–7.0)
Neutrophils: 41 %
Platelets: 314 x10E3/uL (ref 150–450)
RBC: 5.23 x10E6/uL (ref 3.77–5.28)
RDW: 14.9 % (ref 11.7–15.4)
WBC: 6.5 x10E3/uL (ref 3.4–10.8)

## 2024-09-11 LAB — COMPREHENSIVE METABOLIC PANEL WITH GFR
ALT: 25 IU/L (ref 0–32)
AST: 30 IU/L (ref 0–40)
Albumin: 4.7 g/dL (ref 3.9–4.9)
Alkaline Phosphatase: 78 IU/L (ref 49–135)
BUN/Creatinine Ratio: 16 (ref 12–28)
BUN: 15 mg/dL (ref 8–27)
Bilirubin Total: 0.3 mg/dL (ref 0.0–1.2)
CO2: 23 mmol/L (ref 20–29)
Calcium: 9.8 mg/dL (ref 8.7–10.3)
Chloride: 102 mmol/L (ref 96–106)
Creatinine, Ser: 0.92 mg/dL (ref 0.57–1.00)
Globulin, Total: 3.3 g/dL (ref 1.5–4.5)
Glucose: 92 mg/dL (ref 70–99)
Potassium: 4.3 mmol/L (ref 3.5–5.2)
Sodium: 139 mmol/L (ref 134–144)
Total Protein: 8 g/dL (ref 6.0–8.5)
eGFR: 70 mL/min/1.73 (ref >59)

## 2024-09-11 LAB — URINALYSIS, ROUTINE W REFLEX MICROSCOPIC
Bilirubin, UA: NEGATIVE
Glucose, UA: NEGATIVE
Ketones, UA: NEGATIVE
Leukocytes,UA: NEGATIVE
Nitrite, UA: POSITIVE — AB
Protein,UA: NEGATIVE
RBC, UA: NEGATIVE
Specific Gravity, UA: 1.021 (ref 1.005–1.030)
Urobilinogen, Ur: 0.2 mg/dL (ref 0.2–1.0)
pH, UA: 5.5 (ref 5.0–7.5)

## 2024-09-11 LAB — MICROSCOPIC EXAMINATION
Casts: NONE SEEN /LPF
RBC, Urine: NONE SEEN /HPF (ref 0–2)

## 2024-09-11 LAB — LIPID PANEL
Chol/HDL Ratio: 3.5 ratio (ref 0.0–4.4)
Cholesterol, Total: 225 mg/dL — ABNORMAL HIGH (ref 100–199)
HDL: 65 mg/dL (ref >39)
LDL Chol Calc (NIH): 115 mg/dL — ABNORMAL HIGH (ref 0–99)
Triglycerides: 260 mg/dL — ABNORMAL HIGH (ref 0–149)
VLDL Cholesterol Cal: 45 mg/dL — ABNORMAL HIGH (ref 5–40)

## 2024-09-11 LAB — TSH: TSH: 1.76 u[IU]/mL (ref 0.450–4.500)

## 2025-03-11 ENCOUNTER — Encounter: Admitting: Student
